# Patient Record
Sex: Female | Born: 2000 | Hispanic: No | Marital: Single | Smoking: Never smoker
Health system: Southern US, Community
[De-identification: ages and names within clinical notes are randomized; demographics above are authoritative.]

---
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
## 2020-06-22 ENCOUNTER — Other Ambulatory Visit: Payer: Self-pay

## 2020-06-22 ENCOUNTER — Encounter (HOSPITAL_COMMUNITY): Payer: Self-pay

## 2020-06-22 DIAGNOSIS — R1110 Vomiting, unspecified: Secondary | ICD-10-CM | POA: Insufficient documentation

## 2020-06-22 LAB — HM PAP SMEAR

## 2020-06-22 MED ORDER — ONDANSETRON 4 MG PO TBDP
4.0000 mg | ORAL_TABLET | Freq: Once | ORAL | Status: AC | PRN
  Administered 2020-06-22: 4 mg via ORAL
  Filled 2020-06-22: qty 1

## 2020-06-22 NOTE — ED Triage Notes (Signed)
Patient arrived stating that she had her IUD placed today at 1130 and has been vomiting since. Declines any pain at this time. Was given promethazine for nausea but cannot keep it down.

## 2020-06-23 ENCOUNTER — Emergency Department (HOSPITAL_COMMUNITY)
Admission: EM | Admit: 2020-06-23 | Discharge: 2020-06-23 | Disposition: A | Discharge status: Home / Self Care | Payer: Self-pay | Attending: Emergency Medicine | Admitting: Emergency Medicine

## 2020-06-23 DIAGNOSIS — R1115 Cyclical vomiting syndrome unrelated to migraine: Secondary | ICD-10-CM

## 2020-06-23 MED ORDER — METOCLOPRAMIDE HCL 5 MG/ML IJ SOLN: 10.0000 mg | Freq: Once | INTRAMUSCULAR | Status: DC

## 2020-06-23 MED ORDER — FAMOTIDINE IN NACL 20-0.9 MG/50ML-% IV SOLN
20.0000 mg | INTRAVENOUS | Status: AC
  Administered 2020-06-23: 20 mg via INTRAVENOUS
  Filled 2020-06-23: qty 50

## 2020-06-23 MED ORDER — METOCLOPRAMIDE HCL 5 MG/ML IJ SOLN
10.0000 mg | Freq: Once | INTRAMUSCULAR | Status: AC
  Administered 2020-06-23: 10 mg via INTRAVENOUS
  Filled 2020-06-23: qty 2

## 2020-06-23 MED ORDER — SODIUM CHLORIDE 0.9 % IV BOLUS
1000.0000 mL | Freq: Once | INTRAVENOUS | Status: AC
  Administered 2020-06-23: 1000 mL via INTRAVENOUS

## 2020-06-23 MED ORDER — KETOROLAC TROMETHAMINE 30 MG/ML IJ SOLN
15.0000 mg | Freq: Once | INTRAMUSCULAR | Status: AC
  Administered 2020-06-23: 15 mg via INTRAVENOUS
  Filled 2020-06-23: qty 1

## 2020-06-23 NOTE — Discharge Instructions (Signed)
Continue promethazine as needed for nausea management.  Follow-up with your GYN today as scheduled.  Return for new or concerning symptoms.

## 2020-06-23 NOTE — ED Provider Notes (Signed)
[facility]-EMERGENCY DEPT Provider Note   CSN: [id number] Arrival date & time: 06/22/20  2103     History Chief Complaint  Patient presents with  . Emesis    Sarah Christensen is a 19 y.o. female.  19 year old female presents to the emergency department for evaluation of persistent vomiting.  She had an IUD placed yesterday by her OB/GYN, Dr. Juliene Pina, and developed vomiting approximately 30 seconds after completion of this procedure.  She has had some minimal suprapubic cramping.  Complains mostly of feelings of reflux in her central chest from persistent emesis.  Was given Phenergan to take at home, but was unable to tolerate this medication.  Did drink some fluids in triage after ODT Zofran.  Began vomiting again when roomed in the ED.  Reporting mild headache at this time.  States that she has follow-up with her GYN scheduled at 8 AM today.  The history is provided by the patient. No language interpreter was used.  Emesis      History reviewed. No pertinent past medical history.  Patient Active Problem List   Diagnosis Date Noted  . Appendicitis with perforation 03/06/2014    Past Surgical History:  Procedure Laterality Date  . LAPAROSCOPIC APPENDECTOMY N/A 03/06/2014   Procedure: APPENDECTOMY LAPAROSCOPIC;  Surgeon: Judie Petit. Leonia Corona, MD;  Location: [facility] OR;  Service: Pediatrics;  Laterality: N/A;     OB History   No obstetric history on file.     No family history on file.  Social History   Tobacco Use  . Smoking status: Never Smoker  . Smokeless tobacco: Never Used  Substance Use Topics  . Alcohol use: No  . Drug use: No    Home Medications Prior to Admission medications   Medication Sig Start Date End Date Taking? Authorizing Provider  norgestimate-ethinyl estradiol (ORTHO-CYCLEN,SPRINTEC,PREVIFEM) 0.25-35 MG-MCG tablet Take 1 tablet by mouth daily. 08/31/17   Sherren Mocha, MD    Allergies    Apple, Avocado, and Other  Review of  Systems   Review of Systems  Gastrointestinal: Positive for vomiting.  Ten systems reviewed and are negative for acute change, except as noted in the HPI.    Physical Exam Updated Vital Signs BP 96/70 (BP Location: Right Arm)   Pulse 81   Temp 99.4 F (37.4 C) (Oral)   Resp 17   Ht 5\' 1"  (1.549 m)   Wt 52.6 kg   SpO2 100%   BMI 21.92 kg/m   Physical Exam Vitals and nursing note reviewed.  Constitutional:      General: She is not in acute distress.    Appearance: She is well-developed. She is not diaphoretic.     Comments: Nontoxic appearing and in NAD  HENT:     Head: Normocephalic and atraumatic.  Eyes:     General: No scleral icterus.    Conjunctiva/sclera: Conjunctivae normal.  Pulmonary:     Effort: Pulmonary effort is normal. No respiratory distress.     Comments: Respirations even and unlabored Abdominal:     Comments: Benign exam. Soft, nondistended.  Musculoskeletal:        General: Normal range of motion.     Cervical back: Normal range of motion.  Skin:    General: Skin is warm and dry.     Coloration: Skin is not pale.     Findings: No erythema or rash.  Neurological:     Mental Status: She is alert and oriented to person, place, and time.  Psychiatric:  Behavior: Behavior normal.     ED Results / Procedures / Treatments   Labs (all labs ordered are listed, but only abnormal results are displayed) Labs Reviewed - No data to display  EKG None  Radiology No results found.  Procedures Procedures (including critical care time)  Medications Ordered in ED Medications  ondansetron (ZOFRAN-ODT) disintegrating tablet 4 mg (4 mg Oral Given 06/22/20 2200)  sodium chloride 0.9 % bolus 1,000 mL (0 mLs Intravenous Stopped 06/23/20 0534)  famotidine (PEPCID) IVPB 20 mg premix (0 mg Intravenous Stopped 06/23/20 0534)  metoCLOPramide (REGLAN) injection 10 mg (10 mg Intravenous Given 06/23/20 0439)  ketorolac (TORADOL) 30 MG/ML injection 15 mg (15  mg Intravenous Given 06/23/20 0438)    ED Course  I have reviewed the triage vital signs and the nursing notes.  Pertinent labs & imaging results that were available during my care of the patient were reviewed by me and considered in my medical decision making (see chart for details).  Clinical Course as of Jun 23 558  Fri Jun 23, 2020  7673 Patient tolerating ice chips. States she feels "like a new person". Comfortable with discharge.   [KH]    Clinical Course User Index [KH] Darylene Price   MDM Rules/Calculators/A&P                          19 year old female presents to the emergency department for evaluation of vomiting after having an IUD placed by her OB/GYN yesterday morning.  She has minimal to no complaints of abdominal pain.  Reports persistent vomiting despite Phenergan.  Has had symptomatic control following IV fluids, Pepcid, Reglan.  Now tolerating ice chips and states she is feeling better.  She has follow-up with her OB/GYN this morning.  Stable for discharge.  Return precautions discussed and provided. Patient with no unaddressed concerns.   Final Clinical Impression(s) / ED Diagnoses Final diagnoses:  Vomiting, persistent, in adult    Rx / DC Orders ED Discharge Orders    None       Antony Madura, PA-C 06/23/20 0559    Palumbo, April, MD 06/23/20 [phone]

## 2020-06-23 NOTE — ED Notes (Signed)
D/c no concerns at this time. Pt reports feeling better.

## 2020-06-23 NOTE — ED Notes (Signed)
Pt vomiting.

## 2020-06-23 NOTE — ED Notes (Signed)
Patient states after OTD zofran she feels better but wanting IV fluids. In lobby patient tolerated a Gatorade, water, and bottle of ginger ale.

## 2020-07-31 ENCOUNTER — Other Ambulatory Visit: Payer: Self-pay

## 2020-07-31 ENCOUNTER — Encounter (HOSPITAL_COMMUNITY): Payer: Self-pay

## 2020-07-31 ENCOUNTER — Encounter: Payer: Self-pay | Admitting: Gastroenterology

## 2020-07-31 ENCOUNTER — Emergency Department (HOSPITAL_COMMUNITY)
Admission: EM | Admit: 2020-07-31 | Discharge: 2020-07-31 | Disposition: A | Discharge status: Home / Self Care | Payer: 59 | Attending: Emergency Medicine | Admitting: Emergency Medicine

## 2020-07-31 DIAGNOSIS — R112 Nausea with vomiting, unspecified: Secondary | ICD-10-CM | POA: Insufficient documentation

## 2020-07-31 DIAGNOSIS — E876 Hypokalemia: Secondary | ICD-10-CM

## 2020-07-31 LAB — COMPREHENSIVE METABOLIC PANEL
ALT: 35 U/L (ref 0–44)
AST: 27 U/L (ref 15–41)
Albumin: 5.5 g/dL — ABNORMAL HIGH (ref 3.5–5.0)
Alkaline Phosphatase: 107 U/L (ref 38–126)
Anion gap: 15 (ref 5–15)
BUN: 27 mg/dL — ABNORMAL HIGH (ref 6–20)
CO2: 26 mmol/L (ref 22–32)
Calcium: 9.9 mg/dL (ref 8.9–10.3)
Chloride: 100 mmol/L (ref 98–111)
Creatinine, Ser: 0.82 mg/dL (ref 0.44–1.00)
GFR, Estimated: >60 mL/min (ref >60)
Glucose, Bld: 101 mg/dL — ABNORMAL HIGH (ref 70–99)
Potassium: 3.1 mmol/L — ABNORMAL LOW (ref 3.5–5.1)
Sodium: 141 mmol/L (ref 135–145)
Total Bilirubin: 1.4 mg/dL — ABNORMAL HIGH (ref 0.3–1.2)
Total Protein: 9.5 g/dL — ABNORMAL HIGH (ref 6.5–8.1)

## 2020-07-31 LAB — CBC
HCT: 45.3 % (ref 36.0–46.0)
Hemoglobin: 15.4 g/dL — ABNORMAL HIGH (ref 12.0–15.0)
MCH: 30.8 pg (ref 26.0–34.0)
MCHC: 34 g/dL (ref 30.0–36.0)
MCV: 90.6 fL (ref 80.0–100.0)
Platelets: 355 10*3/uL (ref 150–400)
RBC: 5 MIL/uL (ref 3.87–5.11)
RDW: 13.1 % (ref 11.5–15.5)
WBC: 8.3 10*3/uL (ref 4.0–10.5)
nRBC: 0 % (ref 0.0–0.2)

## 2020-07-31 LAB — URINALYSIS, ROUTINE W REFLEX MICROSCOPIC
Bilirubin Urine: NEGATIVE
Glucose, UA: NEGATIVE mg/dL
Ketones, ur: 20 mg/dL — AB
Leukocytes,Ua: NEGATIVE
Nitrite: NEGATIVE
Protein, ur: 100 mg/dL — AB
Specific Gravity, Urine: 1.033 — ABNORMAL HIGH (ref 1.005–1.030)
pH: 6 (ref 5.0–8.0)

## 2020-07-31 LAB — LIPASE, BLOOD: Lipase: 40 U/L (ref 11–51)

## 2020-07-31 LAB — TYPE AND SCREEN
ABO/RH(D): A POS
Antibody Screen: NEGATIVE

## 2020-07-31 LAB — I-STAT BETA HCG BLOOD, ED (MC, WL, AP ONLY): I-stat hCG, quantitative: <5 m[IU]/mL (ref <5)

## 2020-07-31 MED ORDER — SODIUM CHLORIDE 0.9 % IV SOLN
1000.0000 mL | INTRAVENOUS | Status: DC
  Administered 2020-07-31: 1000 mL via INTRAVENOUS

## 2020-07-31 MED ORDER — FAMOTIDINE 20 MG PO TABS
20.0000 mg | ORAL_TABLET | Freq: Once | ORAL | Status: AC
  Administered 2020-07-31: 20 mg via ORAL
  Filled 2020-07-31: qty 1

## 2020-07-31 MED ORDER — PROCHLORPERAZINE EDISYLATE 10 MG/2ML IJ SOLN
10.0000 mg | Freq: Once | INTRAMUSCULAR | Status: AC
  Administered 2020-07-31: 10 mg via INTRAVENOUS
  Filled 2020-07-31: qty 2

## 2020-07-31 MED ORDER — DIPHENHYDRAMINE HCL 50 MG/ML IJ SOLN
12.5000 mg | Freq: Once | INTRAMUSCULAR | Status: AC
  Administered 2020-07-31: 12.5 mg via INTRAVENOUS
  Filled 2020-07-31: qty 1

## 2020-07-31 MED ORDER — SODIUM CHLORIDE 0.9 % IV BOLUS (SEPSIS)
1000.0000 mL | Freq: Once | INTRAVENOUS | Status: AC
  Administered 2020-07-31: 1000 mL via INTRAVENOUS

## 2020-07-31 MED ORDER — ONDANSETRON 8 MG PO TBDP: 8.0000 mg | ORAL_TABLET | Freq: Three times a day (TID) | ORAL | 0 refills | Status: AC | PRN

## 2020-07-31 MED ORDER — ONDANSETRON HCL 4 MG/2ML IJ SOLN
4.0000 mg | Freq: Once | INTRAMUSCULAR | Status: AC
  Administered 2020-07-31: 4 mg via INTRAVENOUS
  Filled 2020-07-31: qty 2

## 2020-07-31 MED ORDER — PANTOPRAZOLE SODIUM 20 MG PO TBEC: 20.0000 mg | DELAYED_RELEASE_TABLET | Freq: Two times a day (BID) | ORAL | 0 refills | Status: AC

## 2020-07-31 NOTE — ED Provider Notes (Signed)
[facility]-EMERGENCY DEPT Provider Note   CSN: [id number] Arrival date & time: 07/31/20  6389     History Chief Complaint  Patient presents with  . Hematemesis    Sarah Christensen is a 19 y.o. female.  HPI   Patient presents to the ED for evaluation of nausea and vomiting.  Patient states she had an episode about a month ago where she had continuous nausea and vomiting with the start of her menstrual cycle.  Patient just had an IUD placed on October 21.  Patient noticed that she started having nausea and vomiting shortly after that procedure.  Patient was given Phenergan but she continued to have nausea and vomiting.  Patient was treated in the ED with IV fluids Pepcid and Reglan.  Her symptoms resolved.  Patient followed up with her OB/GYN doctor.  She ended up having her IUD removed and her symptoms had all resolved until the last couple of days when they restarted again.  Patient states she had been feeling completely well until she started having her menstrual period and then she began having nausea and vomiting again.  She is not having any abdominal pain but more just has some discomfort with a sensation of nausea.  She tried taking some Emetrol without relief.  This morning she tried eating something and drinking something and she vomited again so she came to the ED.  She is not having any fevers.  She did notice some specks of blood in the vomit yesterday but none today.  Patient does not have any history of prior abdominal problems other than having an appendectomy in 2015  PMHX: None  Patient Active Problem List   Diagnosis Date Noted  . Appendicitis with perforation 03/06/2014    Past Surgical History:  Procedure Laterality Date  . APPENDECTOMY    . LAPAROSCOPIC APPENDECTOMY N/A 03/06/2014   Procedure: APPENDECTOMY LAPAROSCOPIC;  Surgeon: Judie Petit. Leonia Corona, MD;  Location: [facility] OR;  Service: Pediatrics;  Laterality: N/A;     OB History   No obstetric  history on file.     History reviewed. No pertinent family history.  Social History   Tobacco Use  . Smoking status: Never Smoker  . Smokeless tobacco: Never Used  Vaping Use  . Vaping Use: Never used  Substance Use Topics  . Alcohol use: Yes  . Drug use: No    Home Medications Prior to Admission medications   Medication Sig Start Date End Date Taking? Authorizing Provider  Alum & Mag Hydroxide-Simeth (GELUSIL PO) Take 1 tablet by mouth every 6 (six) hours as needed (indigestion).   Yes [provider]  anti-nausea (EMETROL) solution Take 10 mLs by mouth every 15 (fifteen) minutes as needed for nausea or vomiting.   Yes [provider]  ibuprofen (ADVIL) 200 MG tablet Take 400 mg by mouth every 6 (six) hours as needed for fever, headache or mild pain.   Yes [provider]  norgestimate-ethinyl estradiol (ORTHO-CYCLEN,SPRINTEC,PREVIFEM) 0.25-35 MG-MCG tablet Take 1 tablet by mouth daily. Patient not taking: Reported on 07/31/2020 08/31/17   Sherren Mocha, MD  ondansetron (ZOFRAN ODT) 8 MG disintegrating tablet Take 1 tablet (8 mg total) by mouth every 8 (eight) hours as needed for nausea or vomiting. 07/31/20   Linwood Dibbles, MD  pantoprazole (PROTONIX) 20 MG tablet Take 1 tablet (20 mg total) by mouth 2 (two) times daily. 07/31/20   Linwood Dibbles, MD    Allergies    Apple, Avocado, and Other  Review of Systems   Review of Systems  All other systems reviewed and are negative.   Physical Exam Updated Vital Signs BP 100/79   Pulse 79   Temp 97.8 F (36.6 C) (Oral)   Resp 20   Ht 1.549 m (5\' 1" )   Wt 47.2 kg   LMP 07/31/2020   SpO2 100%   BMI 19.65 kg/m   Physical Exam Vitals and nursing note reviewed.  Constitutional:      General: She is not in acute distress.    Appearance: She is well-developed.  HENT:     Head: Normocephalic and atraumatic.     Right Ear: External ear normal.     Left Ear: External ear normal.  Eyes:     General: No  scleral icterus.       Right eye: No discharge.        Left eye: No discharge.     Conjunctiva/sclera: Conjunctivae normal.  Neck:     Trachea: No tracheal deviation.  Cardiovascular:     Rate and Rhythm: Normal rate and regular rhythm.  Pulmonary:     Effort: Pulmonary effort is normal. No respiratory distress.     Breath sounds: Normal breath sounds. No stridor. No wheezing or rales.  Abdominal:     General: Bowel sounds are normal. There is no distension.     Palpations: Abdomen is soft.     Tenderness: There is no abdominal tenderness. There is no guarding or rebound.  Musculoskeletal:        General: No tenderness.     Cervical back: Neck supple.  Skin:    General: Skin is warm and dry.     Findings: No rash.  Neurological:     Mental Status: She is alert.     Cranial Nerves: No cranial nerve deficit (no facial droop, extraocular movements intact, no slurred speech).     Sensory: No sensory deficit.     Motor: No abnormal muscle tone or seizure activity.     Coordination: Coordination normal.     ED Results / Procedures / Treatments   Labs (all labs ordered are listed, but only abnormal results are displayed) Labs Reviewed  COMPREHENSIVE METABOLIC PANEL - Abnormal; Notable for the following components:      Result Value   Potassium 3.1 (*)    Glucose, Bld 101 (*)    BUN 27 (*)    Total Protein 9.5 (*)    Albumin 5.5 (*)    Total Bilirubin 1.4 (*)    All other components within normal limits  CBC - Abnormal; Notable for the following components:   Hemoglobin 15.4 (*)    All other components within normal limits  URINALYSIS, ROUTINE W REFLEX MICROSCOPIC - Abnormal; Notable for the following components:   APPearance CLOUDY (*)    Specific Gravity, Urine 1.033 (*)    Hgb urine dipstick LARGE (*)    Ketones, ur 20 (*)    Protein, ur 100 (*)    Bacteria, UA FEW (*)    All other components within normal limits  LIPASE, BLOOD  I-STAT BETA HCG BLOOD, ED ([facility], [facility], [facility]  ONLY)  TYPE AND SCREEN  ABO/RH    EKG None  Radiology No results found.  Procedures Procedures (including critical care time)  Medications Ordered in ED Medications  sodium chloride 0.9 % bolus 1,000 mL (0 mLs Intravenous Stopped 07/31/20 1412)    Followed by  0.9 %  sodium chloride infusion (1,000 mLs Intravenous  New Bag/Given 07/31/20 0927)  ondansetron (ZOFRAN) injection 4 mg (4 mg Intravenous Given 07/31/20 0924)  prochlorperazine (COMPAZINE) injection 10 mg (10 mg Intravenous Given 07/31/20 0952)  diphenhydrAMINE (BENADRYL) injection 12.5 mg (12.5 mg Intravenous Given 07/31/20 0951)  ondansetron (ZOFRAN) injection 4 mg (4 mg Intravenous Given 07/31/20 1512)  famotidine (PEPCID) tablet 20 mg (20 mg Oral Given 07/31/20 1518)    ED Course  I have reviewed the triage vital signs and the nursing notes.  Pertinent labs & imaging results that were available during my care of the patient were reviewed by me and considered in my medical decision making (see chart for details).  Clinical Course as of Aug 01 1611  [facility] Jul 31, 2020  7517 Sx persisted after iv zofran.  Pt requested additional meds.  Compazine ordered   [JK]  1005 Labs reviewed.  BUN increased.  Potassium decreased.  Pregnancy negative.  Bilirubin slightly elevated.  CBC normal.   [JK]  1544 Urinalysis without signs of infection   [JK]    Clinical Course User Index [JK] Linwood Dibbles, MD   MDM Rules/Calculators/A&P                          Patient presented to the ED for evaluation of nausea and vomiting that started a few days ago.  Patient states she had a similar episode 1 month ago with her menstrual period but she attributed to her recent IUD.  Patient's ED work-up is reassuring.  She does have slight increase in her BUN consistent with dehydration associated with her nausea and vomiting.  Patient did have decreased potassium level but I think this will correct with normal dietary intake.  Urinalysis does not  suggest infection.  Consistent with her having her menstrual period.  Patient improved after IV fluids and antiemetics.  Will discharge home with a prescription for Protonix and Zofran.  Discussed outpatient follow-up with GI as patient does have symptoms concerning for possible gastritis or ulcer disease.  Relationship to her menstrual periods suggest could be related to that and she will follow up with her GYN doctor to discuss further treatment.  At this time there does not appear to be any evidence of an acute emergency medical condition and the patient appears stable for discharge with appropriate outpatient follow up.  Final Clinical Impression(s) / ED Diagnoses Final diagnoses:  Non-intractable vomiting with nausea, unspecified vomiting type    Rx / DC Orders ED Discharge Orders         Ordered    ondansetron (ZOFRAN ODT) 8 MG disintegrating tablet  Every 8 hours PRN        07/31/20 1610    pantoprazole (PROTONIX) 20 MG tablet  2 times daily        07/31/20 1610           Linwood Dibbles, MD 07/31/20 [phone]

## 2020-07-31 NOTE — Discharge Instructions (Addendum)
Take the medications as prescribed for your nausea and acid symptoms.   Consider following up with a GI doctor for further evaluation.  Also follow-up with your GYN doctor as we discussed to help with medications that may help reduce the symptoms associated with your menstrual period.  Over the next few days try to increase your dietary potassium intake.  This includes foods such as potatoes, bananas as well as other foods listed in your discharge diagnosis

## 2020-07-31 NOTE — ED Triage Notes (Signed)
Patient reports that she had an IUD a month ago and had continuous vomiting until the IUD was removed.  Patient states she is currently having her period and is having continuous vomiting x 3 days.  Patient states she saw  Black flaky blood in her vomitus yesterday, but not today.

## 2020-08-03 ENCOUNTER — Ambulatory Visit: Payer: 59 | Admitting: Internal Medicine

## 2020-08-16 ENCOUNTER — Encounter: Payer: Self-pay | Admitting: Internal Medicine

## 2020-08-16 ENCOUNTER — Ambulatory Visit: Payer: 59 | Admitting: Internal Medicine

## 2020-08-16 ENCOUNTER — Other Ambulatory Visit: Payer: Self-pay

## 2020-08-16 VITALS — BP 106/68 | HR 77 | Temp 98.2°F | Ht 61.0 in | Wt 116.0 lb

## 2020-08-16 DIAGNOSIS — Z0001 Encounter for general adult medical examination with abnormal findings: Secondary | ICD-10-CM | POA: Diagnosis not present

## 2020-08-16 DIAGNOSIS — N926 Irregular menstruation, unspecified: Secondary | ICD-10-CM

## 2020-08-16 DIAGNOSIS — F5089 Other specified eating disorder: Secondary | ICD-10-CM | POA: Diagnosis not present

## 2020-08-16 DIAGNOSIS — F3281 Premenstrual dysphoric disorder: Secondary | ICD-10-CM

## 2020-08-16 LAB — CBC WITH DIFFERENTIAL/PLATELET
Basophils Absolute: 0 10*3/uL (ref 0.0–0.1)
Basophils Relative: 0.5 % (ref 0.0–3.0)
Eosinophils Absolute: 0 10*3/uL (ref 0.0–0.7)
Eosinophils Relative: 0.6 % (ref 0.0–5.0)
HCT: 37.8 % (ref 36.0–46.0)
Hemoglobin: 12.6 g/dL (ref 12.0–15.0)
Lymphocytes Relative: 30.6 % (ref 12.0–46.0)
Lymphs Abs: 1.8 10*3/uL (ref 0.7–4.0)
MCHC: 33.3 g/dL (ref 30.0–36.0)
MCV: 92.5 fl (ref 78.0–100.0)
Monocytes Absolute: 0.4 10*3/uL (ref 0.1–1.0)
Monocytes Relative: 7.4 % (ref 3.0–12.0)
Neutro Abs: 3.6 10*3/uL (ref 1.4–7.7)
Neutrophils Relative %: 60.9 % (ref 43.0–77.0)
Platelets: 244 10*3/uL (ref 150.0–400.0)
RBC: 4.08 Mil/uL (ref 3.87–5.11)
RDW: 13.6 % (ref 11.5–15.5)
WBC: 5.9 10*3/uL (ref 4.0–10.5)

## 2020-08-16 LAB — LIPID PANEL
Cholesterol: 195 mg/dL (ref 0–200)
HDL: 71.8 mg/dL (ref >39.00)
LDL Cholesterol: 112 mg/dL — ABNORMAL HIGH (ref 0–99)
NonHDL: 122.96
Total CHOL/HDL Ratio: 3
Triglycerides: 53 mg/dL (ref 0.0–149.0)
VLDL: 10.6 mg/dL (ref 0.0–40.0)

## 2020-08-16 LAB — BASIC METABOLIC PANEL
BUN: 9 mg/dL (ref 6–23)
CO2: 25 mEq/L (ref 19–32)
Calcium: 9 mg/dL (ref 8.4–10.5)
Chloride: 104 mEq/L (ref 96–112)
Creatinine, Ser: 0.68 mg/dL (ref 0.40–1.20)
GFR: 124.26 mL/min (ref >60.00)
Glucose, Bld: 79 mg/dL (ref 70–99)
Potassium: 3.7 mEq/L (ref 3.5–5.1)
Sodium: 137 mEq/L (ref 135–145)

## 2020-08-16 LAB — HEPATIC FUNCTION PANEL
ALT: 17 U/L (ref 0–35)
AST: 17 U/L (ref 0–37)
Albumin: 4.2 g/dL (ref 3.5–5.2)
Alkaline Phosphatase: 88 U/L (ref 39–117)
Bilirubin, Direct: 0.1 mg/dL (ref 0.0–0.3)
Total Bilirubin: 0.5 mg/dL (ref 0.2–1.2)
Total Protein: 7.1 g/dL (ref 6.0–8.3)

## 2020-08-16 LAB — TSH: TSH: 0.98 u[IU]/mL (ref 0.35–4.50)

## 2020-08-16 MED ORDER — FLUOXETINE HCL 10 MG PO TABS: 10.0000 mg | ORAL_TABLET | Freq: Every day | ORAL | 1 refills | Status: AC

## 2020-08-16 NOTE — Progress Notes (Signed)
Subjective:  Patient ID: Sarah Christensen, female    DOB: 2001/02/10  Age: 19 y.o. MRN: [id number]  CC: Annual Exam  This visit occurred during the SARS-CoV-2 public health emergency.  Safety protocols were in place, including screening questions prior to the visit, additional usage of staff PPE, and extensive cleaning of exam room while observing appropriate contact time as indicated for disinfecting solutions.    HPI Sarah Christensen presents for establishing.  She complains that she has had irregular cycles over the last 6 months.  She has had 2 cycles during that time.  She had C. difficile infection last June.  She has recurrent episodes of nausea and vomiting with her cycles.  Her symptoms were so severe recently that she was seen in the ED and had abnormal electrolytes.  She said she feels depressed during her cycles and has mild cramping.  She saw her gynecologist about 2 months ago and no advice or treatment was offered.  She recently had an IUD removed because she was not tolerating it.  He has an appointment with a gastroenterologist next month and she request a refill to him.  History Sarah Christensen has no past medical history on file.   She has a past surgical history that includes laparoscopic appendectomy (N/A, 03/06/2014); Appendectomy; and Wisdom tooth extraction.   Her family history includes Diabetes in her paternal grandmother; Healthy in her father and mother; Hyperlipidemia in her maternal grandfather and paternal grandmother; Hypertension in her maternal grandfather and paternal grandmother; Stroke in her paternal grandmother.She reports that she has never smoked. She has never used smokeless tobacco. She reports current alcohol use. She reports that she does not use drugs.  Outpatient Medications Prior to Visit  Medication Sig Dispense Refill  . Alum & Mag Hydroxide-Simeth (GELUSIL PO) Take 1 tablet by mouth every 6 (six) hours as needed (indigestion).    Marland Kitchen anti-nausea (EMETROL)  solution Take 10 mLs by mouth every 15 (fifteen) minutes as needed for nausea or vomiting.    . norgestimate-ethinyl estradiol (ORTHO-CYCLEN,SPRINTEC,PREVIFEM) 0.25-35 MG-MCG tablet Take 1 tablet by mouth daily. 3 Package 4  . ondansetron (ZOFRAN ODT) 8 MG disintegrating tablet Take 1 tablet (8 mg total) by mouth every 8 (eight) hours as needed for nausea or vomiting. 12 tablet 0  . pantoprazole (PROTONIX) 20 MG tablet Take 1 tablet (20 mg total) by mouth 2 (two) times daily. 20 tablet 0  . ibuprofen (ADVIL) 200 MG tablet Take 400 mg by mouth every 6 (six) hours as needed for fever, headache or mild pain.     No facility-administered medications prior to visit.    ROS Review of Systems  Constitutional: Negative for appetite change, chills, diaphoresis, fatigue and fever.  HENT: Negative.   Eyes: Negative for visual disturbance.  Respiratory: Negative for cough, chest tightness, shortness of breath and wheezing.   Cardiovascular: Negative for chest pain, palpitations and leg swelling.  Gastrointestinal: Negative for abdominal pain, diarrhea, nausea and vomiting.  Endocrine: Negative.  Negative for heat intolerance.  Genitourinary: Positive for menstrual problem. Negative for decreased urine volume, difficulty urinating, dysuria and vaginal bleeding.  Musculoskeletal: Negative for arthralgias and myalgias.  Skin: Negative.  Negative for color change and pallor.  Neurological: Negative.  Negative for dizziness, weakness, light-headedness and headaches.  Hematological: Negative for adenopathy. Does not bruise/bleed easily.  Psychiatric/Behavioral: Positive for dysphoric mood. Negative for decreased concentration, self-injury, sleep disturbance and suicidal ideas. The patient is not nervous/anxious.     Objective:  BP 106/68  Pulse 77   Temp 98.2 F (36.8 C) (Oral)   Ht 5\' 1"  (1.549 m)   Wt 116 lb (52.6 kg)   LMP 07/31/2020   SpO2 98%   BMI 21.92 kg/m   Physical Exam Vitals  reviewed.  Constitutional:      Appearance: Normal appearance.  HENT:     Nose: Nose normal.     Mouth/Throat:     Mouth: Mucous membranes are moist.  Eyes:     General: No scleral icterus.    Conjunctiva/sclera: Conjunctivae normal.  Cardiovascular:     Rate and Rhythm: Normal rate and regular rhythm.     Heart sounds: No murmur heard.   Pulmonary:     Effort: Pulmonary effort is normal.     Breath sounds: No stridor. No wheezing, rhonchi or rales.  Abdominal:     General: Abdomen is flat. Bowel sounds are normal. There is no distension.     Palpations: Abdomen is soft. There is no hepatomegaly, splenomegaly or mass.     Tenderness: There is no abdominal tenderness.  Musculoskeletal:        General: Normal range of motion.     Cervical back: Neck supple.     Right lower leg: No edema.     Left lower leg: No edema.  Lymphadenopathy:     Cervical: No cervical adenopathy.  Skin:    General: Skin is warm and dry.  Neurological:     General: No focal deficit present.     Mental Status: She is alert and oriented to person, place, and time. Mental status is at baseline.  Psychiatric:        Mood and Affect: Mood normal.        Behavior: Behavior normal.        Thought Content: Thought content normal.        Judgment: Judgment normal.     Lab Results  Component Value Date   WBC 5.9 08/16/2020   HGB 12.6 08/16/2020   HCT 37.8 08/16/2020   PLT 244.0 08/16/2020   GLUCOSE 79 08/16/2020   CHOL 195 08/16/2020   TRIG 53.0 08/16/2020   HDL 71.80 08/16/2020   LDLCALC 112 (H) 08/16/2020   ALT 17 08/16/2020   AST 17 08/16/2020   NA 137 08/16/2020   K 3.7 08/16/2020   CL 104 08/16/2020   CREATININE 0.68 08/16/2020   BUN 9 08/16/2020   CO2 25 08/16/2020   TSH 0.98 08/16/2020    Assessment & Plan:   Sarah Christensen was seen today for annual exam.  Diagnoses and all orders for this visit:  PMDD (premenstrual dysphoric disorder)- I am concerned this is contributing to her  symptoms.  I recommended that she take fluoxetine the week prior to her menstrual cycle in the week of her menstrual cycle. -     FLUoxetine (PROZAC) 10 MG tablet; Take 1 tablet (10 mg total) by mouth daily. -     CBC with Differential/Platelet; Future -     Basic metabolic panel; Future -     TSH; Future -     Hepatic function panel; Future -     Hepatic function panel -     TSH -     Basic metabolic panel -     CBC with Differential/Platelet  Psychogenic vomiting with nausea- Her labs have normalized.  I do not see a clear explanation for this.  She will see a gastroenterologist next month. -     Basic metabolic  panel; Future -     TSH; Future -     Hepatic function panel; Future -     Hepatic function panel -     TSH -     Basic metabolic panel -     Ambulatory referral to Gastroenterology  Encounter for general adult medical examination with abnormal findings- Exam completed, labs reviewed, she refused vaccines against tetanus and influenza, cancer screenings are up-to-date, patient education was given. -     Lipid panel; Future -     Lipid panel  Irregular menstrual cycle- Her labs do not reveal any secondary causes for this. -     CBC with Differential/Platelet; Future -     Basic metabolic panel; Future -     TSH; Future -     Hepatic function panel; Future -     Hepatic function panel -     TSH -     Basic metabolic panel -     CBC with Differential/Platelet   I have discontinued Sarah Christensen's ibuprofen. I am also having her start on FLUoxetine. Additionally, I am having her maintain her norgestimate-ethinyl estradiol, anti-nausea, Alum & Mag Hydroxide-Simeth (GELUSIL PO), ondansetron, and pantoprazole.  Meds ordered this encounter  Medications  . FLUoxetine (PROZAC) 10 MG tablet    Sig: Take 1 tablet (10 mg total) by mouth daily.    Dispense:  90 tablet    Refill:  1     Follow-up: Return in about 4 weeks (around 09/13/2020).  Sanda Linger, MD

## 2020-08-16 NOTE — Patient Instructions (Signed)
Health Maintenance, Female Adopting a healthy lifestyle and getting preventive care are important in promoting health and wellness. Ask your health care provider about:  The right schedule for you to have regular tests and exams.  Things you can do on your own to prevent diseases and keep yourself healthy. What should I know about diet, weight, and exercise? Eat a healthy diet   Eat a diet that includes plenty of vegetables, fruits, low-fat dairy products, and lean protein.  Do not eat a lot of foods that are high in solid fats, added sugars, or sodium. Maintain a healthy weight Body mass index (BMI) is used to identify weight problems. It estimates body fat based on height and weight. Your health care provider can help determine your BMI and help you achieve or maintain a healthy weight. Get regular exercise Get regular exercise. This is one of the most important things you can do for your health. Most adults should:  Exercise for at least 150 minutes each week. The exercise should increase your heart rate and make you sweat (moderate-intensity exercise).  Do strengthening exercises at least twice a week. This is in addition to the moderate-intensity exercise.  Spend less time sitting. Even light physical activity can be beneficial. Watch cholesterol and blood lipids Have your blood tested for lipids and cholesterol at 20 years of age, then have this test every 5 years. Have your cholesterol levels checked more often if:  Your lipid or cholesterol levels are high.  You are older than 21 years of age.  You are at high risk for heart disease. What should I know about cancer screening? Depending on your health history and family history, you may need to have cancer screening at various ages. This may include screening for:  Breast cancer.  Cervical cancer.  Colorectal cancer.  Skin cancer.  Lung cancer. What should I know about heart disease, diabetes, and high blood  pressure? Blood pressure and heart disease  High blood pressure causes heart disease and increases the risk of stroke. This is more likely to develop in people who have high blood pressure readings, are of African descent, or are overweight.  Have your blood pressure checked: ? Every 3-5 years if you are 18-39 years of age. ? Every year if you are 40 years old or older. Diabetes Have regular diabetes screenings. This checks your fasting blood sugar level. Have the screening done:  Once every three years after age 40 if you are at a normal weight and have a low risk for diabetes.  More often and at a younger age if you are overweight or have a high risk for diabetes. What should I know about preventing infection? Hepatitis B If you have a higher risk for hepatitis B, you should be screened for this virus. Talk with your health care provider to find out if you are at risk for hepatitis B infection. Hepatitis C Testing is recommended for:  Everyone born from 1945 through 1965.  Anyone with known risk factors for hepatitis C. Sexually transmitted infections (STIs)  Get screened for STIs, including gonorrhea and chlamydia, if: ? You are sexually active and are younger than 21 years of age. ? You are older than 21 years of age and your health care provider tells you that you are at risk for this type of infection. ? Your sexual activity has changed since you were last screened, and you are at increased risk for chlamydia or gonorrhea. Ask your health care provider if   you are at risk.  Ask your health care provider about whether you are at high risk for HIV. Your health care provider may recommend a prescription medicine to help prevent HIV infection. If you choose to take medicine to prevent HIV, you should first get tested for HIV. You should then be tested every 3 months for as long as you are taking the medicine. Pregnancy  If you are about to stop having your period (premenopausal) and  you may become pregnant, seek counseling before you get pregnant.  Take 400 to 800 micrograms (mcg) of folic acid every day if you become pregnant.  Ask for birth control (contraception) if you want to prevent pregnancy. Osteoporosis and menopause Osteoporosis is a disease in which the bones lose minerals and strength with aging. This can result in bone fractures. If you are 65 years old or older, or if you are at risk for osteoporosis and fractures, ask your health care provider if you should:  Be screened for bone loss.  Take a calcium or vitamin D supplement to lower your risk of fractures.  Be given hormone replacement therapy (HRT) to treat symptoms of menopause. Follow these instructions at home: Lifestyle  Do not use any products that contain nicotine or tobacco, such as cigarettes, e-cigarettes, and chewing tobacco. If you need help quitting, ask your health care provider.  Do not use street drugs.  Do not share needles.  Ask your health care provider for help if you need support or information about quitting drugs. Alcohol use  Do not drink alcohol if: ? Your health care provider tells you not to drink. ? You are pregnant, may be pregnant, or are planning to become pregnant.  If you drink alcohol: ? Limit how much you use to 0-1 drink a day. ? Limit intake if you are breastfeeding.  Be aware of how much alcohol is in your drink. In the [place] one drink equals one 12 oz bottle of beer (355 mL), one 5 oz glass of wine (148 mL), or one 1 oz glass of hard liquor (44 mL). General instructions  Schedule regular health, dental, and eye exams.  Stay current with your vaccines.  Tell your health care provider if: ? You often feel depressed. ? You have ever been abused or do not feel safe at home. Summary  Adopting a healthy lifestyle and getting preventive care are important in promoting health and wellness.  Follow your health care provider's instructions about healthy  diet, exercising, and getting tested or screened for diseases.  Follow your health care provider's instructions on monitoring your cholesterol and blood pressure. This information is not intended to replace advice given to you by your health care provider. Make sure you discuss any questions you have with your health care provider. Document Revised: 08/12/2018 Document Reviewed: 08/12/2018 [employer] Patient Education  2020 [employer]  

## 2020-08-17 ENCOUNTER — Telehealth: Payer: Self-pay

## 2020-08-17 NOTE — Telephone Encounter (Signed)
KeyRichardson Christensen

## 2020-08-21 NOTE — Telephone Encounter (Signed)
Determination resulted in a denial for coverage.

## 2020-08-28 ENCOUNTER — Telehealth: Payer: Self-pay | Admitting: Internal Medicine

## 2020-08-28 NOTE — Telephone Encounter (Signed)
Labs will be mailed via [employer]

## 2020-08-28 NOTE — Telephone Encounter (Signed)
Patient states she asked if she could get her recent labs emailed to her when she spoke to someone about her results and she hasn't received them in her email so she was just calling back to request that. [email or web address]

## 2020-10-02 ENCOUNTER — Ambulatory Visit: Payer: 59 | Admitting: Gastroenterology
